# Patient Record
Sex: Male | Born: 1985 | Race: White | Hispanic: Yes | Marital: Single | Smoking: Current every day smoker
Health system: Southern US, Community
[De-identification: ages and names within clinical notes are randomized; demographics above are authoritative.]

---

## 2010-08-19 ENCOUNTER — Emergency Department (HOSPITAL_COMMUNITY)
Admission: EM | Admit: 2010-08-19 | Discharge: 2010-08-19 | Payer: Self-pay | Source: Home / Self Care | Admitting: Emergency Medicine

## 2010-11-16 ENCOUNTER — Emergency Department (HOSPITAL_COMMUNITY)
Admission: EM | Admit: 2010-11-16 | Discharge: 2010-11-16 | Disposition: A | Discharge status: Home / Self Care | Payer: BC Managed Care – PPO | Attending: Emergency Medicine | Admitting: Emergency Medicine

## 2010-11-16 ENCOUNTER — Emergency Department (HOSPITAL_COMMUNITY): Payer: BC Managed Care – PPO

## 2010-11-16 DIAGNOSIS — R404 Transient alteration of awareness: Secondary | ICD-10-CM | POA: Insufficient documentation

## 2010-11-16 DIAGNOSIS — R079 Chest pain, unspecified: Secondary | ICD-10-CM | POA: Insufficient documentation

## 2010-11-16 DIAGNOSIS — M7989 Other specified soft tissue disorders: Secondary | ICD-10-CM | POA: Insufficient documentation

## 2010-11-16 DIAGNOSIS — S5010XA Contusion of unspecified forearm, initial encounter: Secondary | ICD-10-CM | POA: Insufficient documentation

## 2010-11-16 DIAGNOSIS — R51 Headache: Secondary | ICD-10-CM | POA: Insufficient documentation

## 2010-11-16 DIAGNOSIS — S060X9A Concussion with loss of consciousness of unspecified duration, initial encounter: Secondary | ICD-10-CM | POA: Insufficient documentation

## 2010-11-16 DIAGNOSIS — H5789 Other specified disorders of eye and adnexa: Secondary | ICD-10-CM | POA: Insufficient documentation

## 2010-11-16 DIAGNOSIS — IMO0002 Reserved for concepts with insufficient information to code with codable children: Secondary | ICD-10-CM | POA: Insufficient documentation

## 2010-11-16 DIAGNOSIS — S20219A Contusion of unspecified front wall of thorax, initial encounter: Secondary | ICD-10-CM | POA: Insufficient documentation

## 2010-11-16 DIAGNOSIS — S0180XA Unspecified open wound of other part of head, initial encounter: Secondary | ICD-10-CM | POA: Insufficient documentation

## 2010-11-16 DIAGNOSIS — H539 Unspecified visual disturbance: Secondary | ICD-10-CM | POA: Insufficient documentation

## 2010-11-16 DIAGNOSIS — M79609 Pain in unspecified limb: Secondary | ICD-10-CM | POA: Insufficient documentation

## 2010-11-16 DIAGNOSIS — M25539 Pain in unspecified wrist: Secondary | ICD-10-CM | POA: Insufficient documentation

## 2010-11-16 DIAGNOSIS — S0010XA Contusion of unspecified eyelid and periocular area, initial encounter: Secondary | ICD-10-CM | POA: Insufficient documentation

## 2010-11-16 LAB — CBC
MCH: 31.5 pg (ref 26.0–34.0)
MCV: 88.6 fL (ref 78.0–100.0)
Platelets: 239 10*3/uL (ref 150–400)
RBC: 5.34 MIL/uL (ref 4.22–5.81)
RDW: 12.4 % (ref 11.5–15.5)

## 2010-11-16 LAB — COMPREHENSIVE METABOLIC PANEL
BUN: 11 mg/dL (ref 6–23)
CO2: 25 mEq/L (ref 19–32)
Calcium: 9.8 mg/dL (ref 8.4–10.5)
Creatinine, Ser: 0.86 mg/dL (ref 0.4–1.5)
GFR calc non Af Amer: >60 mL/min (ref >60)
Glucose, Bld: 106 mg/dL — ABNORMAL HIGH (ref 70–99)
Sodium: 137 mEq/L (ref 135–145)
Total Protein: 8.4 g/dL — ABNORMAL HIGH (ref 6.0–8.3)

## 2010-11-16 LAB — PROTIME-INR: Prothrombin Time: 13.2 seconds (ref 11.6–15.2)

## 2010-11-16 LAB — LACTIC ACID, PLASMA: Lactic Acid, Venous: 2.1 mmol/L (ref 0.5–2.2)

## 2010-11-21 ENCOUNTER — Inpatient Hospital Stay (INDEPENDENT_AMBULATORY_CARE_PROVIDER_SITE_OTHER)
Admission: RE | Admit: 2010-11-21 | Discharge: 2010-11-21 | Disposition: A | Discharge status: Home / Self Care | Payer: BC Managed Care – PPO | Source: Ambulatory Visit | Attending: Family Medicine | Admitting: Family Medicine

## 2010-11-21 DIAGNOSIS — S0180XA Unspecified open wound of other part of head, initial encounter: Secondary | ICD-10-CM

## 2010-11-22 ENCOUNTER — Inpatient Hospital Stay (INDEPENDENT_AMBULATORY_CARE_PROVIDER_SITE_OTHER)
Admission: RE | Admit: 2010-11-22 | Discharge: 2010-11-22 | Disposition: A | Discharge status: Home / Self Care | Payer: Worker's Compensation | Source: Ambulatory Visit | Attending: Family Medicine | Admitting: Family Medicine

## 2010-11-22 DIAGNOSIS — R51 Headache: Secondary | ICD-10-CM

## 2010-11-22 DIAGNOSIS — IMO0002 Reserved for concepts with insufficient information to code with codable children: Secondary | ICD-10-CM

## 2010-11-22 DIAGNOSIS — S20219A Contusion of unspecified front wall of thorax, initial encounter: Secondary | ICD-10-CM

## 2010-11-22 DIAGNOSIS — S5010XA Contusion of unspecified forearm, initial encounter: Secondary | ICD-10-CM

## 2010-11-28 ENCOUNTER — Inpatient Hospital Stay (HOSPITAL_COMMUNITY)
Admission: RE | Admit: 2010-11-28 | Discharge: 2010-11-28 | Disposition: A | Discharge status: Home / Self Care | Payer: BC Managed Care – PPO | Source: Ambulatory Visit

## 2010-11-29 ENCOUNTER — Emergency Department (HOSPITAL_COMMUNITY): Payer: BC Managed Care – PPO

## 2010-11-29 ENCOUNTER — Emergency Department (HOSPITAL_COMMUNITY)
Admission: EM | Admit: 2010-11-29 | Discharge: 2010-11-29 | Disposition: A | Discharge status: Home / Self Care | Payer: BC Managed Care – PPO | Attending: Emergency Medicine | Admitting: Emergency Medicine

## 2010-11-29 ENCOUNTER — Inpatient Hospital Stay (HOSPITAL_COMMUNITY)
Admission: RE | Admit: 2010-11-29 | Discharge: 2010-11-29 | Disposition: A | Discharge status: Home / Self Care | Payer: BC Managed Care – PPO | Source: Ambulatory Visit | Attending: Family Medicine | Admitting: Family Medicine

## 2010-11-29 DIAGNOSIS — W010XXA Fall on same level from slipping, tripping and stumbling without subsequent striking against object, initial encounter: Secondary | ICD-10-CM | POA: Insufficient documentation

## 2010-11-29 DIAGNOSIS — F0781 Postconcussional syndrome: Secondary | ICD-10-CM | POA: Insufficient documentation

## 2010-11-29 DIAGNOSIS — R509 Fever, unspecified: Secondary | ICD-10-CM | POA: Insufficient documentation

## 2010-11-29 DIAGNOSIS — Y929 Unspecified place or not applicable: Secondary | ICD-10-CM | POA: Insufficient documentation

## 2010-11-29 DIAGNOSIS — R0602 Shortness of breath: Secondary | ICD-10-CM | POA: Insufficient documentation

## 2010-11-29 DIAGNOSIS — S20219A Contusion of unspecified front wall of thorax, initial encounter: Secondary | ICD-10-CM | POA: Insufficient documentation

## 2010-11-29 DIAGNOSIS — R42 Dizziness and giddiness: Secondary | ICD-10-CM | POA: Insufficient documentation

## 2010-11-29 DIAGNOSIS — R071 Chest pain on breathing: Secondary | ICD-10-CM | POA: Insufficient documentation

## 2010-11-29 DIAGNOSIS — IMO0002 Reserved for concepts with insufficient information to code with codable children: Secondary | ICD-10-CM | POA: Insufficient documentation

## 2011-08-24 ENCOUNTER — Emergency Department (HOSPITAL_COMMUNITY)
Admission: EM | Admit: 2011-08-24 | Discharge: 2011-08-24 | Disposition: A | Discharge status: Home / Self Care | Payer: Self-pay | Attending: Emergency Medicine | Admitting: Emergency Medicine

## 2011-08-24 ENCOUNTER — Emergency Department (HOSPITAL_COMMUNITY): Payer: Self-pay

## 2011-08-24 ENCOUNTER — Encounter (HOSPITAL_COMMUNITY): Payer: Self-pay | Admitting: *Deleted

## 2011-08-24 DIAGNOSIS — R509 Fever, unspecified: Secondary | ICD-10-CM | POA: Insufficient documentation

## 2011-08-24 DIAGNOSIS — R197 Diarrhea, unspecified: Secondary | ICD-10-CM | POA: Insufficient documentation

## 2011-08-24 DIAGNOSIS — R05 Cough: Secondary | ICD-10-CM | POA: Insufficient documentation

## 2011-08-24 DIAGNOSIS — J111 Influenza due to unidentified influenza virus with other respiratory manifestations: Secondary | ICD-10-CM | POA: Insufficient documentation

## 2011-08-24 DIAGNOSIS — R059 Cough, unspecified: Secondary | ICD-10-CM | POA: Insufficient documentation

## 2011-08-24 DIAGNOSIS — IMO0001 Reserved for inherently not codable concepts without codable children: Secondary | ICD-10-CM | POA: Insufficient documentation

## 2011-08-24 DIAGNOSIS — R51 Headache: Secondary | ICD-10-CM | POA: Insufficient documentation

## 2011-08-24 DIAGNOSIS — R6889 Other general symptoms and signs: Secondary | ICD-10-CM

## 2011-08-24 DIAGNOSIS — R1110 Vomiting, unspecified: Secondary | ICD-10-CM | POA: Insufficient documentation

## 2011-08-24 DIAGNOSIS — H9209 Otalgia, unspecified ear: Secondary | ICD-10-CM | POA: Insufficient documentation

## 2011-08-24 DIAGNOSIS — J3489 Other specified disorders of nose and nasal sinuses: Secondary | ICD-10-CM | POA: Insufficient documentation

## 2011-08-24 DIAGNOSIS — R109 Unspecified abdominal pain: Secondary | ICD-10-CM | POA: Insufficient documentation

## 2011-08-24 MED ORDER — ONDANSETRON HCL 4 MG PO TABS: 4.0000 mg | ORAL_TABLET | Freq: Four times a day (QID) | ORAL | Status: AC

## 2011-08-24 MED ORDER — BENZONATATE 100 MG PO CAPS: 100.0000 mg | ORAL_CAPSULE | Freq: Three times a day (TID) | ORAL | Status: AC

## 2011-08-24 MED ORDER — TRAMADOL-ACETAMINOPHEN 37.5-325 MG PO TABS: 1.0000 | ORAL_TABLET | Freq: Four times a day (QID) | ORAL | Status: AC | PRN

## 2011-08-24 MED ORDER — DIPHENOXYLATE-ATROPINE 2.5-0.025 MG PO TABS: 1.0000 | ORAL_TABLET | Freq: Four times a day (QID) | ORAL | Status: AC | PRN

## 2011-08-24 NOTE — ED Provider Notes (Signed)
History     CSN: [id number]  Arrival date & time 08/24/11  1717   First MD Initiated Contact with Patient 08/24/11 1816      Chief Complaint  Patient presents with  . Influenza    (Consider location/radiation/quality/duration/timing/severity/associated sxs/prior treatment) HPI  Pt presents to the ED with complaints of flu-like symptoms of cough, congestion, sore throat, muscle aches, chills, fevers, ear pain, headaches, abdominal pain, vomiting, diarrhea. The patient states that the symptoms started two weeks ago.  Pt has been around other sick contacts and did not get the flu shot this year. The patient denies headaches, neck pain, weakness, vision changes, severe abdominal pain, inability to eat or drink, difficulty breathing, SOB, wheezing, chest pain. The patient has tried cough medicine, NSAIDS, and rest but has only felt mild relief.    History reviewed. No pertinent past medical history.  History reviewed. No pertinent past surgical history.  History reviewed. No pertinent family history.  History  Substance Use Topics  . Smoking status: Current Everyday Smoker  . Smokeless tobacco: Not on file  . Alcohol Use: Yes      Review of Systems  All other systems reviewed and are negative.    Allergies  Review of patient's allergies indicates no known allergies.  Home Medications   Current Outpatient Rx  Name Route Sig Dispense Refill  . ALKA-SELTZER PLUS SINUS PO Oral Take 2 tablets by mouth once. pain reliever/fever reducer    . NYQUIL PO Oral Take 5 mLs by mouth once. For cold and flu relief    . BENZONATATE 100 MG PO CAPS Oral Take 1 capsule (100 mg total) by mouth every 8 (eight) hours. 21 capsule 0  . DIPHENOXYLATE-ATROPINE 2.5-0.025 MG PO TABS Oral Take 1 tablet by mouth 4 (four) times daily as needed for diarrhea or loose stools. 30 tablet 0  . ONDANSETRON HCL 4 MG PO TABS Oral Take 1 tablet (4 mg total) by mouth every 6 (six) hours. 12 tablet 0  .  TRAMADOL-ACETAMINOPHEN 37.5-325 MG PO TABS Oral Take 1 tablet by mouth every 6 (six) hours as needed for pain. 30 tablet 0    BP 138/84  Pulse 83  Temp(Src) 97.8 F (36.6 C) (Oral)  Resp 16  Ht 5\' 6"  (1.676 m)  Wt 180 lb (81.647 kg)  BMI 29.05 kg/m2  SpO2 98%  Physical Exam  Nursing note and vitals reviewed. Constitutional: He is oriented to person, place, and time. He appears well-developed and well-nourished.  HENT:  Head: Normocephalic and atraumatic.  Eyes: EOM are normal. Pupils are equal, round, and reactive to light.  Neck: Normal range of motion.  Cardiovascular: Normal rate and regular rhythm.   Pulmonary/Chest: Effort normal and breath sounds normal. No respiratory distress. He has no wheezes. He has no rales. He exhibits no tenderness.  Abdominal: Soft. Bowel sounds are normal. He exhibits no distension and no mass. There is no tenderness. There is no rebound and no guarding.  Musculoskeletal: Normal range of motion.  Neurological: He is alert and oriented to person, place, and time.  Skin: Skin is warm and dry.    ED Course  Procedures (including critical care time)  Labs Reviewed - No data to display Dg Chest 2 View  08/24/2011  *RADIOLOGY REPORT*  Clinical Data: Cough, fever  CHEST - 2 VIEW  Comparison: 11/29/2010  Findings: Cardiomediastinal silhouette is within normal limits. The lungs are clear. No pleural effusion.  No pneumothorax.  No acute osseous abnormality.  IMPRESSION:  Normal chest.  Original Report Authenticated By: Harrel Lemon, M.D.     1. Flu-like symptoms       MDM  Pt has viral syndrome will offer symptomatic tx and have patient follow-up with PCP or return to the ED if he declines or fails to resolve in the next week.         Dorthula Matas, PA 08/24/11 2010

## 2011-08-24 NOTE — ED Provider Notes (Signed)
Medical screening examination/treatment/procedure(s) were performed by non-physician practitioner and as supervising physician I was immediately available for consultation/collaboration.  Stryder Poitra, MD 08/24/11 2017 

## 2011-08-24 NOTE — ED Notes (Signed)
He says he has had the flu for 2 weeks with coughing  Vomiting and his body aches and the aches are getting worse

## 2012-08-05 ENCOUNTER — Other Ambulatory Visit (HOSPITAL_COMMUNITY): Payer: Self-pay | Admitting: Chiropractic Medicine

## 2012-08-05 DIAGNOSIS — R51 Headache: Secondary | ICD-10-CM

## 2012-08-08 ENCOUNTER — Ambulatory Visit (HOSPITAL_COMMUNITY)
Admission: RE | Admit: 2012-08-08 | Discharge: 2012-08-08 | Disposition: A | Discharge status: Home / Self Care | Payer: Self-pay | Source: Ambulatory Visit | Attending: Chiropractic Medicine | Admitting: Chiropractic Medicine

## 2012-08-08 DIAGNOSIS — M79609 Pain in unspecified limb: Secondary | ICD-10-CM | POA: Insufficient documentation

## 2012-08-08 DIAGNOSIS — R209 Unspecified disturbances of skin sensation: Secondary | ICD-10-CM | POA: Insufficient documentation

## 2012-08-08 DIAGNOSIS — M542 Cervicalgia: Secondary | ICD-10-CM | POA: Insufficient documentation

## 2012-08-08 DIAGNOSIS — R51 Headache: Secondary | ICD-10-CM
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
## 2015-07-03 ENCOUNTER — Encounter (HOSPITAL_COMMUNITY): Payer: Self-pay | Admitting: Emergency Medicine

## 2015-07-03 ENCOUNTER — Emergency Department (HOSPITAL_COMMUNITY)
Admission: EM | Admit: 2015-07-03 | Discharge: 2015-07-03 | Disposition: A | Discharge status: Home / Self Care | Payer: No Typology Code available for payment source | Attending: Emergency Medicine | Admitting: Emergency Medicine

## 2015-07-03 ENCOUNTER — Emergency Department (HOSPITAL_COMMUNITY): Payer: No Typology Code available for payment source

## 2015-07-03 DIAGNOSIS — S3992XA Unspecified injury of lower back, initial encounter: Secondary | ICD-10-CM | POA: Insufficient documentation

## 2015-07-03 DIAGNOSIS — S29002A Unspecified injury of muscle and tendon of back wall of thorax, initial encounter: Secondary | ICD-10-CM | POA: Insufficient documentation

## 2015-07-03 DIAGNOSIS — S4992XA Unspecified injury of left shoulder and upper arm, initial encounter: Secondary | ICD-10-CM | POA: Insufficient documentation

## 2015-07-03 DIAGNOSIS — Y92410 Unspecified street and highway as the place of occurrence of the external cause: Secondary | ICD-10-CM | POA: Diagnosis not present

## 2015-07-03 DIAGNOSIS — Y9389 Activity, other specified: Secondary | ICD-10-CM | POA: Diagnosis not present

## 2015-07-03 DIAGNOSIS — Y998 Other external cause status: Secondary | ICD-10-CM | POA: Diagnosis not present

## 2015-07-03 DIAGNOSIS — F17200 Nicotine dependence, unspecified, uncomplicated: Secondary | ICD-10-CM | POA: Diagnosis not present

## 2015-07-03 DIAGNOSIS — M545 Low back pain: Secondary | ICD-10-CM

## 2015-07-03 DIAGNOSIS — S299XXA Unspecified injury of thorax, initial encounter: Secondary | ICD-10-CM | POA: Diagnosis present

## 2015-07-03 DIAGNOSIS — S29001A Unspecified injury of muscle and tendon of front wall of thorax, initial encounter: Secondary | ICD-10-CM | POA: Insufficient documentation

## 2015-07-03 DIAGNOSIS — R079 Chest pain, unspecified: Secondary | ICD-10-CM

## 2015-07-03 DIAGNOSIS — S199XXA Unspecified injury of neck, initial encounter: Secondary | ICD-10-CM | POA: Insufficient documentation

## 2015-07-03 DIAGNOSIS — M546 Pain in thoracic spine: Secondary | ICD-10-CM

## 2015-07-03 MED ORDER — CYCLOBENZAPRINE HCL 10 MG PO TABS
5.0000 mg | ORAL_TABLET | Freq: Once | ORAL | Status: AC
  Administered 2015-07-03: 5 mg via ORAL
  Filled 2015-07-03: qty 1

## 2015-07-03 MED ORDER — OXYCODONE-ACETAMINOPHEN 5-325 MG PO TABS
1.0000 | ORAL_TABLET | Freq: Once | ORAL | Status: AC
  Administered 2015-07-03: 1 via ORAL
  Filled 2015-07-03: qty 1

## 2015-07-03 MED ORDER — IBUPROFEN 600 MG PO TABS: 600.0000 mg | ORAL_TABLET | Freq: Three times a day (TID) | ORAL | Status: AC | PRN

## 2015-07-03 MED ORDER — IBUPROFEN 400 MG PO TABS
600.0000 mg | ORAL_TABLET | Freq: Once | ORAL | Status: AC
  Administered 2015-07-03: 600 mg via ORAL
  Filled 2015-07-03: qty 1

## 2015-07-03 MED ORDER — CYCLOBENZAPRINE HCL 10 MG PO TABS: 10.0000 mg | ORAL_TABLET | Freq: Three times a day (TID) | ORAL | Status: AC | PRN
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
## 2015-07-03 NOTE — Discharge Instructions (Signed)

## 2023-10-23 ENCOUNTER — Emergency Department (HOSPITAL_COMMUNITY)
Admission: EM | Admit: 2023-10-23 | Discharge: 2023-10-24 | Discharge status: Against Medical Advice | Payer: Self-pay | Attending: Emergency Medicine | Admitting: Emergency Medicine

## 2023-10-23 ENCOUNTER — Other Ambulatory Visit: Payer: Self-pay

## 2023-10-23 ENCOUNTER — Emergency Department (HOSPITAL_COMMUNITY): Payer: Self-pay

## 2023-10-23 ENCOUNTER — Encounter (HOSPITAL_COMMUNITY): Payer: Self-pay

## 2023-10-23 DIAGNOSIS — M549 Dorsalgia, unspecified: Secondary | ICD-10-CM | POA: Insufficient documentation

## 2023-10-23 DIAGNOSIS — R202 Paresthesia of skin: Secondary | ICD-10-CM | POA: Insufficient documentation

## 2023-10-23 DIAGNOSIS — R42 Dizziness and giddiness: Secondary | ICD-10-CM | POA: Insufficient documentation

## 2023-10-23 DIAGNOSIS — R509 Fever, unspecified: Secondary | ICD-10-CM | POA: Insufficient documentation

## 2023-10-23 DIAGNOSIS — Z5321 Procedure and treatment not carried out due to patient leaving prior to being seen by health care provider: Secondary | ICD-10-CM | POA: Insufficient documentation

## 2023-10-23 DIAGNOSIS — R079 Chest pain, unspecified: Secondary | ICD-10-CM | POA: Insufficient documentation

## 2023-10-23 DIAGNOSIS — M542 Cervicalgia: Secondary | ICD-10-CM | POA: Insufficient documentation

## 2023-10-23 DIAGNOSIS — R519 Headache, unspecified: Secondary | ICD-10-CM | POA: Insufficient documentation

## 2023-10-23 DIAGNOSIS — Z20822 Contact with and (suspected) exposure to covid-19: Secondary | ICD-10-CM | POA: Insufficient documentation

## 2023-10-23 LAB — CBC WITH DIFFERENTIAL/PLATELET
Abs Immature Granulocytes: 0.02 10*3/uL (ref 0.00–0.07)
Basophils Absolute: 0 10*3/uL (ref 0.0–0.1)
Basophils Relative: 0 %
Eosinophils Absolute: 0 10*3/uL (ref 0.0–0.5)
Eosinophils Relative: 0 %
HCT: 44.6 % (ref 39.0–52.0)
Hemoglobin: 15.6 g/dL (ref 13.0–17.0)
Immature Granulocytes: 0 %
Lymphocytes Relative: 9 %
Lymphs Abs: 0.7 10*3/uL (ref 0.7–4.0)
MCH: 30.4 pg (ref 26.0–34.0)
MCHC: 35 g/dL (ref 30.0–36.0)
MCV: 86.8 fL (ref 80.0–100.0)
Monocytes Absolute: 0.9 10*3/uL (ref 0.1–1.0)
Monocytes Relative: 11 %
Neutro Abs: 5.9 10*3/uL (ref 1.7–7.7)
Neutrophils Relative %: 80 %
Platelets: 230 10*3/uL (ref 150–400)
RBC: 5.14 MIL/uL (ref 4.22–5.81)
RDW: 12.6 % (ref 11.5–15.5)
WBC: 7.5 10*3/uL (ref 4.0–10.5)
nRBC: 0 % (ref 0.0–0.2)

## 2023-10-23 LAB — BASIC METABOLIC PANEL
Anion gap: 10 (ref 5–15)
BUN: 7 mg/dL (ref 6–20)
CO2: 24 mmol/L (ref 22–32)
Calcium: 9.5 mg/dL (ref 8.9–10.3)
Chloride: 99 mmol/L (ref 98–111)
Creatinine, Ser: 0.76 mg/dL (ref 0.61–1.24)
GFR, Estimated: >60 mL/min (ref >60)
Glucose, Bld: 121 mg/dL — ABNORMAL HIGH (ref 70–99)
Potassium: 3 mmol/L — ABNORMAL LOW (ref 3.5–5.1)
Sodium: 133 mmol/L — ABNORMAL LOW (ref 135–145)

## 2023-10-23 LAB — RESP PANEL BY RT-PCR (RSV, FLU A&B, COVID)  RVPGX2
Influenza A by PCR: NEGATIVE
Influenza B by PCR: NEGATIVE
Resp Syncytial Virus by PCR: NEGATIVE
SARS Coronavirus 2 by RT PCR: NEGATIVE

## 2023-10-23 LAB — URINALYSIS, ROUTINE W REFLEX MICROSCOPIC
Bacteria, UA: NONE SEEN
Bilirubin Urine: NEGATIVE
Glucose, UA: NEGATIVE mg/dL
Ketones, ur: NEGATIVE mg/dL
Leukocytes,Ua: NEGATIVE
Nitrite: NEGATIVE
Protein, ur: NEGATIVE mg/dL
Specific Gravity, Urine: 1.005 (ref 1.005–1.030)
pH: 6 (ref 5.0–8.0)

## 2023-10-23 LAB — TROPONIN I (HIGH SENSITIVITY): Troponin I (High Sensitivity): 47 ng/L — ABNORMAL HIGH (ref <18)
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
## 2023-10-23 NOTE — ED Provider Triage Note (Signed)
 Emergency Medicine Provider Triage Evaluation Note  Jordan Joyce , a 38 y.o. male  was evaluated in triage.  Pt complains of back pain starting in back of head and neck radiate being down to feet.  Chest pain, shortness of breath, fever, dizziness.  Review of Systems  Positive: Dizziness, fever, chills, shortness of breath, chest pain, headache, pain radiating from the neck down the back to feet Negative: Nausea, vomiting, cough, congestion  Physical Exam  BP (!) 182/110 (BP Location: Right Arm)   Pulse (!) 123   Temp 99.2 F (37.3 C) (Oral)   Resp 18   Ht 5\' 9"  (1.753 m)   Wt 82 kg   SpO2 98%   BMI 26.70 kg/m  Gen:   Awake, no distress   Resp:  Normal effort  MSK:   Moves extremities without difficulty  Other:  Patient tachycardic on exam  Medical Decision Making  Medically screening exam initiated at 8:04 PM.  Appropriate orders placed.  Jordan Joyce was informed that the remainder of the evaluation will be completed by another provider, this initial triage assessment does not replace that evaluation, and the importance of remaining in the ED until their evaluation is complete.  Labs and imaging ordered   Jordan Joyce 10/23/23 2006
